# Patient Record
Sex: Male | Born: 1955
Health system: Southern US, Community
[De-identification: ages and names within clinical notes are randomized; demographics above are authoritative.]

## PROBLEM LIST (undated history)

## (undated) DIAGNOSIS — R569 Unspecified convulsions: Secondary | ICD-10-CM

## (undated) HISTORY — PX: BACK SURGERY: SHX140

---

## 1998-04-11 ENCOUNTER — Encounter: Payer: Self-pay | Admitting: Neurosurgery

## 1998-04-11 ENCOUNTER — Ambulatory Visit (HOSPITAL_COMMUNITY): Admission: RE | Admit: 1998-04-11 | Discharge: 1998-04-11 | Payer: Self-pay | Admitting: Neurosurgery

## 1998-06-12 ENCOUNTER — Encounter: Payer: Self-pay | Admitting: Neurosurgery

## 1998-06-12 ENCOUNTER — Ambulatory Visit (HOSPITAL_COMMUNITY): Admission: RE | Admit: 1998-06-12 | Discharge: 1998-06-12 | Payer: Self-pay | Admitting: Neurosurgery

## 1998-06-26 ENCOUNTER — Encounter: Payer: Self-pay | Admitting: Neurosurgery

## 1998-06-26 ENCOUNTER — Ambulatory Visit (HOSPITAL_COMMUNITY): Admission: RE | Admit: 1998-06-26 | Discharge: 1998-06-26 | Payer: Self-pay | Admitting: Neurosurgery

## 1998-07-10 ENCOUNTER — Ambulatory Visit (HOSPITAL_COMMUNITY): Admission: RE | Admit: 1998-07-10 | Discharge: 1998-07-10 | Payer: Self-pay | Admitting: Neurosurgery

## 1998-07-10 ENCOUNTER — Encounter: Payer: Self-pay | Admitting: Neurosurgery

## 1998-09-18 ENCOUNTER — Ambulatory Visit (HOSPITAL_COMMUNITY): Admission: RE | Admit: 1998-09-18 | Discharge: 1998-09-18 | Payer: Self-pay | Admitting: Neurosurgery

## 1998-09-18 ENCOUNTER — Encounter: Payer: Self-pay | Admitting: Neurosurgery

## 1999-07-20 ENCOUNTER — Emergency Department (HOSPITAL_COMMUNITY): Admission: EM | Admit: 1999-07-20 | Discharge: 1999-07-20 | Payer: Self-pay | Admitting: Emergency Medicine

## 2000-03-18 ENCOUNTER — Emergency Department (HOSPITAL_COMMUNITY): Admission: EM | Admit: 2000-03-18 | Discharge: 2000-03-18 | Payer: Self-pay | Admitting: Emergency Medicine

## 2000-03-18 ENCOUNTER — Encounter: Payer: Self-pay | Admitting: Emergency Medicine

## 2000-03-24 ENCOUNTER — Emergency Department (HOSPITAL_COMMUNITY): Admission: EM | Admit: 2000-03-24 | Discharge: 2000-03-24 | Payer: Self-pay | Admitting: Emergency Medicine

## 2000-04-25 ENCOUNTER — Encounter: Payer: Self-pay | Admitting: Emergency Medicine

## 2000-04-25 ENCOUNTER — Inpatient Hospital Stay (HOSPITAL_COMMUNITY): Admission: EM | Admit: 2000-04-25 | Discharge: 2000-05-04 | Payer: Self-pay | Admitting: Emergency Medicine

## 2000-04-26 ENCOUNTER — Encounter: Payer: Self-pay | Admitting: Internal Medicine

## 2000-04-27 ENCOUNTER — Encounter: Payer: Self-pay | Admitting: Internal Medicine

## 2000-05-02 ENCOUNTER — Encounter: Payer: Self-pay | Admitting: Internal Medicine

## 2000-05-23 ENCOUNTER — Emergency Department (HOSPITAL_COMMUNITY): Admission: EM | Admit: 2000-05-23 | Discharge: 2000-05-23 | Payer: Self-pay | Admitting: Emergency Medicine

## 2000-05-24 ENCOUNTER — Emergency Department (HOSPITAL_COMMUNITY): Admission: EM | Admit: 2000-05-24 | Discharge: 2000-05-24 | Payer: Self-pay | Admitting: Emergency Medicine

## 2000-06-01 ENCOUNTER — Inpatient Hospital Stay (HOSPITAL_COMMUNITY): Admission: RE | Admit: 2000-06-01 | Discharge: 2000-06-02 | Payer: Self-pay | Admitting: Neurosurgery

## 2000-06-01 ENCOUNTER — Encounter: Payer: Self-pay | Admitting: Neurosurgery

## 2000-06-16 ENCOUNTER — Encounter: Admission: RE | Admit: 2000-06-16 | Discharge: 2000-06-16 | Payer: Self-pay | Admitting: Neurosurgery

## 2000-06-16 ENCOUNTER — Encounter: Payer: Self-pay | Admitting: Neurosurgery

## 2000-07-26 ENCOUNTER — Encounter: Admission: RE | Admit: 2000-07-26 | Discharge: 2000-07-26 | Payer: Self-pay | Admitting: Neurosurgery

## 2000-07-26 ENCOUNTER — Encounter: Payer: Self-pay | Admitting: Neurosurgery

## 2000-08-07 ENCOUNTER — Emergency Department (HOSPITAL_COMMUNITY): Admission: EM | Admit: 2000-08-07 | Discharge: 2000-08-07 | Payer: Self-pay | Admitting: Emergency Medicine

## 2000-08-07 ENCOUNTER — Encounter: Payer: Self-pay | Admitting: Emergency Medicine

## 2000-08-08 ENCOUNTER — Emergency Department (HOSPITAL_COMMUNITY): Admission: EM | Admit: 2000-08-08 | Discharge: 2000-08-08 | Payer: Self-pay | Admitting: Emergency Medicine

## 2000-08-08 ENCOUNTER — Encounter: Payer: Self-pay | Admitting: Emergency Medicine

## 2015-11-04 DIAGNOSIS — S63295D Dislocation of distal interphalangeal joint of left ring finger, subsequent encounter: Secondary | ICD-10-CM | POA: Diagnosis not present

## 2015-11-04 DIAGNOSIS — I10 Essential (primary) hypertension: Secondary | ICD-10-CM | POA: Diagnosis not present

## 2015-11-04 DIAGNOSIS — S12601D Unspecified nondisplaced fracture of seventh cervical vertebra, subsequent encounter for fracture with routine healing: Secondary | ICD-10-CM | POA: Diagnosis not present

## 2015-11-04 DIAGNOSIS — S62605D Fracture of unspecified phalanx of left ring finger, subsequent encounter for fracture with routine healing: Secondary | ICD-10-CM | POA: Diagnosis not present

## 2015-11-04 DIAGNOSIS — S62162D Displaced fracture of pisiform, left wrist, subsequent encounter for fracture with routine healing: Secondary | ICD-10-CM | POA: Diagnosis not present

## 2015-11-04 DIAGNOSIS — R69 Illness, unspecified: Secondary | ICD-10-CM | POA: Diagnosis not present

## 2015-11-04 DIAGNOSIS — G8929 Other chronic pain: Secondary | ICD-10-CM | POA: Diagnosis not present

## 2015-11-04 DIAGNOSIS — S062X0D Diffuse traumatic brain injury without loss of consciousness, subsequent encounter: Secondary | ICD-10-CM | POA: Diagnosis not present

## 2015-11-04 DIAGNOSIS — S0240DD Maxillary fracture, left side, subsequent encounter for fracture with routine healing: Secondary | ICD-10-CM | POA: Diagnosis not present

## 2015-11-16 DIAGNOSIS — S0240DD Maxillary fracture, left side, subsequent encounter for fracture with routine healing: Secondary | ICD-10-CM | POA: Diagnosis not present

## 2015-11-16 DIAGNOSIS — S63295D Dislocation of distal interphalangeal joint of left ring finger, subsequent encounter: Secondary | ICD-10-CM | POA: Diagnosis not present

## 2015-11-16 DIAGNOSIS — S12601D Unspecified nondisplaced fracture of seventh cervical vertebra, subsequent encounter for fracture with routine healing: Secondary | ICD-10-CM | POA: Diagnosis not present

## 2015-11-16 DIAGNOSIS — G8929 Other chronic pain: Secondary | ICD-10-CM | POA: Diagnosis not present

## 2015-11-16 DIAGNOSIS — I10 Essential (primary) hypertension: Secondary | ICD-10-CM | POA: Diagnosis not present

## 2015-11-16 DIAGNOSIS — S62162D Displaced fracture of pisiform, left wrist, subsequent encounter for fracture with routine healing: Secondary | ICD-10-CM | POA: Diagnosis not present

## 2015-11-16 DIAGNOSIS — R69 Illness, unspecified: Secondary | ICD-10-CM | POA: Diagnosis not present

## 2015-11-16 DIAGNOSIS — S062X0D Diffuse traumatic brain injury without loss of consciousness, subsequent encounter: Secondary | ICD-10-CM | POA: Diagnosis not present

## 2015-11-16 DIAGNOSIS — S62605D Fracture of unspecified phalanx of left ring finger, subsequent encounter for fracture with routine healing: Secondary | ICD-10-CM | POA: Diagnosis not present

## 2015-11-19 DIAGNOSIS — W25XXXA Contact with sharp glass, initial encounter: Secondary | ICD-10-CM | POA: Diagnosis not present

## 2015-11-19 DIAGNOSIS — S61412A Laceration without foreign body of left hand, initial encounter: Secondary | ICD-10-CM | POA: Diagnosis not present

## 2015-11-19 DIAGNOSIS — S62665A Nondisplaced fracture of distal phalanx of left ring finger, initial encounter for closed fracture: Secondary | ICD-10-CM | POA: Diagnosis not present

## 2015-11-19 DIAGNOSIS — S60552A Superficial foreign body of left hand, initial encounter: Secondary | ICD-10-CM | POA: Diagnosis not present

## 2015-11-25 DIAGNOSIS — S0240DD Maxillary fracture, left side, subsequent encounter for fracture with routine healing: Secondary | ICD-10-CM | POA: Diagnosis not present

## 2015-11-25 DIAGNOSIS — S12601D Unspecified nondisplaced fracture of seventh cervical vertebra, subsequent encounter for fracture with routine healing: Secondary | ICD-10-CM | POA: Diagnosis not present

## 2015-11-25 DIAGNOSIS — G8929 Other chronic pain: Secondary | ICD-10-CM | POA: Diagnosis not present

## 2015-11-25 DIAGNOSIS — I10 Essential (primary) hypertension: Secondary | ICD-10-CM | POA: Diagnosis not present

## 2015-11-25 DIAGNOSIS — S62605D Fracture of unspecified phalanx of left ring finger, subsequent encounter for fracture with routine healing: Secondary | ICD-10-CM | POA: Diagnosis not present

## 2015-11-25 DIAGNOSIS — S62162D Displaced fracture of pisiform, left wrist, subsequent encounter for fracture with routine healing: Secondary | ICD-10-CM | POA: Diagnosis not present

## 2015-11-25 DIAGNOSIS — S062X0D Diffuse traumatic brain injury without loss of consciousness, subsequent encounter: Secondary | ICD-10-CM | POA: Diagnosis not present

## 2015-11-25 DIAGNOSIS — R69 Illness, unspecified: Secondary | ICD-10-CM | POA: Diagnosis not present

## 2015-11-25 DIAGNOSIS — S63295D Dislocation of distal interphalangeal joint of left ring finger, subsequent encounter: Secondary | ICD-10-CM | POA: Diagnosis not present

## 2015-11-27 DIAGNOSIS — S62605D Fracture of unspecified phalanx of left ring finger, subsequent encounter for fracture with routine healing: Secondary | ICD-10-CM | POA: Diagnosis not present

## 2015-11-27 DIAGNOSIS — S12601D Unspecified nondisplaced fracture of seventh cervical vertebra, subsequent encounter for fracture with routine healing: Secondary | ICD-10-CM | POA: Diagnosis not present

## 2015-11-27 DIAGNOSIS — S062X0D Diffuse traumatic brain injury without loss of consciousness, subsequent encounter: Secondary | ICD-10-CM | POA: Diagnosis not present

## 2015-11-27 DIAGNOSIS — S63295D Dislocation of distal interphalangeal joint of left ring finger, subsequent encounter: Secondary | ICD-10-CM | POA: Diagnosis not present

## 2015-11-27 DIAGNOSIS — S0240DD Maxillary fracture, left side, subsequent encounter for fracture with routine healing: Secondary | ICD-10-CM | POA: Diagnosis not present

## 2015-11-27 DIAGNOSIS — I10 Essential (primary) hypertension: Secondary | ICD-10-CM | POA: Diagnosis not present

## 2015-11-27 DIAGNOSIS — S62162D Displaced fracture of pisiform, left wrist, subsequent encounter for fracture with routine healing: Secondary | ICD-10-CM | POA: Diagnosis not present

## 2015-11-27 DIAGNOSIS — G8929 Other chronic pain: Secondary | ICD-10-CM | POA: Diagnosis not present

## 2015-11-27 DIAGNOSIS — R69 Illness, unspecified: Secondary | ICD-10-CM | POA: Diagnosis not present

## 2015-12-10 DIAGNOSIS — S61402A Unspecified open wound of left hand, initial encounter: Secondary | ICD-10-CM | POA: Diagnosis not present

## 2015-12-10 DIAGNOSIS — T814XXA Infection following a procedure, initial encounter: Secondary | ICD-10-CM | POA: Diagnosis not present

## 2015-12-10 DIAGNOSIS — Y848 Other medical procedures as the cause of abnormal reaction of the patient, or of later complication, without mention of misadventure at the time of the procedure: Secondary | ICD-10-CM | POA: Diagnosis not present

## 2016-02-02 DIAGNOSIS — R69 Illness, unspecified: Secondary | ICD-10-CM | POA: Diagnosis not present

## 2016-02-02 DIAGNOSIS — I10 Essential (primary) hypertension: Secondary | ICD-10-CM | POA: Diagnosis not present

## 2016-02-02 DIAGNOSIS — M1990 Unspecified osteoarthritis, unspecified site: Secondary | ICD-10-CM | POA: Diagnosis not present

## 2016-02-02 DIAGNOSIS — G894 Chronic pain syndrome: Secondary | ICD-10-CM | POA: Diagnosis not present

## 2016-03-21 DIAGNOSIS — M1991 Primary osteoarthritis, unspecified site: Secondary | ICD-10-CM | POA: Diagnosis not present

## 2016-03-21 DIAGNOSIS — R69 Illness, unspecified: Secondary | ICD-10-CM | POA: Diagnosis not present

## 2016-03-21 DIAGNOSIS — G894 Chronic pain syndrome: Secondary | ICD-10-CM | POA: Diagnosis not present

## 2016-03-21 DIAGNOSIS — I973 Postprocedural hypertension: Secondary | ICD-10-CM | POA: Diagnosis not present

## 2016-04-18 DIAGNOSIS — M1380 Other specified arthritis, unspecified site: Secondary | ICD-10-CM | POA: Diagnosis not present

## 2016-04-18 DIAGNOSIS — R69 Illness, unspecified: Secondary | ICD-10-CM | POA: Diagnosis not present

## 2016-04-18 DIAGNOSIS — G894 Chronic pain syndrome: Secondary | ICD-10-CM | POA: Diagnosis not present

## 2016-05-23 DIAGNOSIS — I10 Essential (primary) hypertension: Secondary | ICD-10-CM | POA: Diagnosis not present

## 2016-05-23 DIAGNOSIS — G894 Chronic pain syndrome: Secondary | ICD-10-CM | POA: Diagnosis not present

## 2016-07-07 DIAGNOSIS — G894 Chronic pain syndrome: Secondary | ICD-10-CM | POA: Diagnosis not present

## 2016-07-07 DIAGNOSIS — R69 Illness, unspecified: Secondary | ICD-10-CM | POA: Diagnosis not present

## 2016-07-07 DIAGNOSIS — M1991 Primary osteoarthritis, unspecified site: Secondary | ICD-10-CM | POA: Diagnosis not present

## 2016-07-07 DIAGNOSIS — I10 Essential (primary) hypertension: Secondary | ICD-10-CM | POA: Diagnosis not present

## 2016-08-04 DIAGNOSIS — M1991 Primary osteoarthritis, unspecified site: Secondary | ICD-10-CM | POA: Diagnosis not present

## 2016-08-04 DIAGNOSIS — I10 Essential (primary) hypertension: Secondary | ICD-10-CM | POA: Diagnosis not present

## 2016-08-04 DIAGNOSIS — G894 Chronic pain syndrome: Secondary | ICD-10-CM | POA: Diagnosis not present

## 2016-08-04 DIAGNOSIS — R69 Illness, unspecified: Secondary | ICD-10-CM | POA: Diagnosis not present

## 2016-09-01 DIAGNOSIS — M1991 Primary osteoarthritis, unspecified site: Secondary | ICD-10-CM | POA: Diagnosis not present

## 2016-09-01 DIAGNOSIS — I10 Essential (primary) hypertension: Secondary | ICD-10-CM | POA: Diagnosis not present

## 2016-09-01 DIAGNOSIS — T86822 Skin graft (allograft) (autograft) infection: Secondary | ICD-10-CM | POA: Diagnosis not present

## 2016-09-01 DIAGNOSIS — R69 Illness, unspecified: Secondary | ICD-10-CM | POA: Diagnosis not present

## 2016-09-01 DIAGNOSIS — G894 Chronic pain syndrome: Secondary | ICD-10-CM | POA: Diagnosis not present

## 2016-09-29 DIAGNOSIS — R69 Illness, unspecified: Secondary | ICD-10-CM | POA: Diagnosis not present

## 2016-09-29 DIAGNOSIS — G894 Chronic pain syndrome: Secondary | ICD-10-CM | POA: Diagnosis not present

## 2016-09-29 DIAGNOSIS — I10 Essential (primary) hypertension: Secondary | ICD-10-CM | POA: Diagnosis not present

## 2016-09-29 DIAGNOSIS — M25512 Pain in left shoulder: Secondary | ICD-10-CM | POA: Diagnosis not present

## 2016-10-27 DIAGNOSIS — R69 Illness, unspecified: Secondary | ICD-10-CM | POA: Diagnosis not present

## 2016-10-27 DIAGNOSIS — G894 Chronic pain syndrome: Secondary | ICD-10-CM | POA: Diagnosis not present

## 2016-10-27 DIAGNOSIS — M25512 Pain in left shoulder: Secondary | ICD-10-CM | POA: Diagnosis not present

## 2016-10-27 DIAGNOSIS — I10 Essential (primary) hypertension: Secondary | ICD-10-CM | POA: Diagnosis not present

## 2016-11-24 DIAGNOSIS — R69 Illness, unspecified: Secondary | ICD-10-CM | POA: Diagnosis not present

## 2016-11-24 DIAGNOSIS — G894 Chronic pain syndrome: Secondary | ICD-10-CM | POA: Diagnosis not present

## 2016-11-24 DIAGNOSIS — I10 Essential (primary) hypertension: Secondary | ICD-10-CM | POA: Diagnosis not present

## 2016-11-24 DIAGNOSIS — M25512 Pain in left shoulder: Secondary | ICD-10-CM | POA: Diagnosis not present

## 2016-12-07 ENCOUNTER — Encounter (HOSPITAL_COMMUNITY): Payer: Self-pay | Admitting: Emergency Medicine

## 2016-12-07 ENCOUNTER — Emergency Department (HOSPITAL_COMMUNITY): Payer: Medicare HMO

## 2016-12-07 ENCOUNTER — Emergency Department (HOSPITAL_COMMUNITY)
Admission: EM | Admit: 2016-12-07 | Discharge: 2016-12-07 | Disposition: A | Discharge status: Home / Self Care | Payer: Medicare HMO | Attending: Emergency Medicine | Admitting: Emergency Medicine

## 2016-12-07 DIAGNOSIS — R079 Chest pain, unspecified: Secondary | ICD-10-CM

## 2016-12-07 DIAGNOSIS — Z79899 Other long term (current) drug therapy: Secondary | ICD-10-CM | POA: Insufficient documentation

## 2016-12-07 DIAGNOSIS — R0789 Other chest pain: Secondary | ICD-10-CM | POA: Diagnosis not present

## 2016-12-07 DIAGNOSIS — R0602 Shortness of breath: Secondary | ICD-10-CM | POA: Diagnosis not present

## 2016-12-07 DIAGNOSIS — Z7982 Long term (current) use of aspirin: Secondary | ICD-10-CM | POA: Insufficient documentation

## 2016-12-07 HISTORY — DX: Unspecified convulsions: R56.9

## 2016-12-07 LAB — CBC
HEMATOCRIT: 44.3 % (ref 39.0–52.0)
Hemoglobin: 14.9 g/dL (ref 13.0–17.0)
MCH: 29.9 pg (ref 26.0–34.0)
MCHC: 33.6 g/dL (ref 30.0–36.0)
MCV: 88.8 fL (ref 78.0–100.0)
PLATELETS: 238 10*3/uL (ref 150–400)
RBC: 4.99 MIL/uL (ref 4.22–5.81)
RDW: 13.3 % (ref 11.5–15.5)
WBC: 9 10*3/uL (ref 4.0–10.5)

## 2016-12-07 LAB — BASIC METABOLIC PANEL
Anion gap: 9 (ref 5–15)
CO2: 23 mmol/L (ref 22–32)
CREATININE: 0.85 mg/dL (ref 0.61–1.24)
Calcium: 8.9 mg/dL (ref 8.9–10.3)
Chloride: 108 mmol/L (ref 101–111)
GFR calc Af Amer: >60 mL/min (ref >60)
GLUCOSE: 80 mg/dL (ref 65–99)
POTASSIUM: 4 mmol/L (ref 3.5–5.1)
Sodium: 140 mmol/L (ref 135–145)

## 2016-12-07 LAB — I-STAT TROPONIN, ED
Troponin i, poc: 0 ng/mL (ref 0.00–0.08)
Troponin i, poc: 0 ng/mL (ref 0.00–0.08)

## 2016-12-07 MED ORDER — LORAZEPAM 2 MG/ML IJ SOLN
1.0000 mg | Freq: Once | INTRAMUSCULAR | Status: AC
  Administered 2016-12-07: 1 mg via INTRAVENOUS
  Filled 2016-12-07: qty 1

## 2016-12-07 MED ORDER — MORPHINE SULFATE (PF) 4 MG/ML IV SOLN
4.0000 mg | Freq: Once | INTRAVENOUS | Status: AC
  Administered 2016-12-07: 4 mg via INTRAVENOUS
  Filled 2016-12-07: qty 1

## 2016-12-07 MED ORDER — OXYCODONE HCL 5 MG PO TABS
15.0000 mg | ORAL_TABLET | Freq: Once | ORAL | Status: AC
  Administered 2016-12-07: 15 mg via ORAL
  Filled 2016-12-07: qty 3

## 2016-12-07 NOTE — ED Provider Notes (Signed)
[facility] EMERGENCY DEPARTMENT Provider Note   CSN: [id number] Arrival date & time: 12/07/16  1943     History   Chief Complaint Chief Complaint  Patient presents with  . Chest Pain    HPI Kevin Cannon is a 61 y.o. male.  Patient here for evaluation of chest pain that started this evening, approximately 4 hours ago. He felt SOB and experienced nausea without vomiting. There was no radiation of the pain that is located in the center of his chest.  No recent cough or fever. Patient is a nonsmoker. He denies cardiac issues in the past. EMS gave him a NTG x 1 with relief of the pain. He currently is having significant pain in his neck, shoulder and back for which he takes 15 mg oxycodone and 10 mg valium. He states he woke on the floor this morning and that he feels he may have had a seizure since he doesn't remember falling out of bed. He has had seizures in the past, the last one "years" ago. He does not take any anti-seizure medication. He got himself back into bed and states when he reached for his medications he dropped his pills on the floor and was not able to reach them so he hasn't had any medication today.    The history is provided by the patient. No language interpreter was used.  Chest Pain   Associated symptoms include back pain, nausea and shortness of breath. Pertinent negatives include no abdominal pain, no cough, no fever, no vomiting and no weakness.  His past medical history is significant for seizures (See HPI.).    Past Medical History:  Diagnosis Date  . Seizures (HCC)     There are no active problems to display for this patient.   Past Surgical History:  Procedure Laterality Date  . BACK SURGERY         Home Medications    Prior to Admission medications   Medication Sig Start Date End Date Taking? Authorizing Provider  aspirin EC 81 MG tablet Take 81 mg by mouth daily.   Yes [provider]  diazepam (VALIUM) 5 MG  tablet Take 5 mg by mouth 2 (two) times daily as needed for anxiety. 11/24/16  Yes [provider]  lisinopril (PRINIVIL,ZESTRIL) 40 MG tablet Take 40 mg by mouth daily.   Yes [provider]  oxyCODONE (ROXICODONE) 15 MG immediate release tablet Take 15 mg by mouth 3 (three) times daily.   Yes [provider]  Thiamine Mononitrate (VITAMIN B1 PO) Take 1 tablet by mouth daily.   Yes [provider]    Family History No family history on file.  Social History Social History  Substance Use Topics  . Smoking status: Never Smoker  . Smokeless tobacco: Never Used  . Alcohol use 0.6 oz/week    1 Cans of beer per week     Allergies   Patient has no known allergies.   Review of Systems Review of Systems  Constitutional: Negative for chills and fever.  HENT: Negative.   Respiratory: Positive for shortness of breath. Negative for cough.   Cardiovascular: Positive for chest pain.  Gastrointestinal: Positive for nausea. Negative for abdominal pain and vomiting.  Musculoskeletal: Positive for back pain and neck pain.       See HPI.  Skin: Negative.   Neurological: Positive for seizures (See HPI.). Negative for weakness.     Physical Exam Updated Vital Signs BP 133/81   Pulse Marland Kitchen)  54   Resp 15   Ht 5\' 9"  (1.753 m)   Wt 63.5 kg (140 lb)   SpO2 100%   BMI 20.67 kg/m   Physical Exam  Constitutional: He is oriented to person, place, and time. He appears well-developed and well-nourished.  Uncomfortable appearing.  HENT:  Head: Normocephalic.  Neck: Normal range of motion. Neck supple.  Cardiovascular: Normal rate and regular rhythm.   No murmur heard. Pulmonary/Chest: Effort normal and breath sounds normal. He has no wheezes. He has no rales. He exhibits no tenderness.  Abdominal: Soft. Bowel sounds are normal. There is no tenderness. There is no rebound and no guarding.  Musculoskeletal: Normal range of motion. He exhibits no edema.    Neurological: He is alert and oriented to person, place, and time.  Skin: Skin is warm and dry. No rash noted.  Psychiatric: He has a normal mood and affect.     ED Treatments / Results  Labs (all labs ordered are listed, but only abnormal results are displayed) Labs Reviewed  BASIC METABOLIC PANEL - Abnormal; Notable for the following:       Result Value   BUN <5 (*)    All other components within normal limits  CBC  I-STAT TROPONIN, ED   Results for orders placed or performed during the hospital encounter of 12/07/16  Basic metabolic panel  Result Value Ref Range   Sodium 140 135 - 145 mmol/L   Potassium 4.0 3.5 - 5.1 mmol/L   Chloride 108 101 - 111 mmol/L   CO2 23 22 - 32 mmol/L   Glucose, Bld 80 65 - 99 mg/dL   BUN <5 (L) 6 - 20 mg/dL   Creatinine, Ser 1.610.85 0.61 - 1.24 mg/dL   Calcium 8.9 8.9 - 09.610.3 mg/dL   GFR calc non Af Amer >60 >60 mL/min   GFR calc Af Amer >60 >60 mL/min   Anion gap 9 5 - 15  CBC  Result Value Ref Range   WBC 9.0 4.0 - 10.5 K/uL   RBC 4.99 4.22 - 5.81 MIL/uL   Hemoglobin 14.9 13.0 - 17.0 g/dL   HCT 04.544.3 40.939.0 - 81.152.0 %   MCV 88.8 78.0 - 100.0 fL   MCH 29.9 26.0 - 34.0 pg   MCHC 33.6 30.0 - 36.0 g/dL   RDW 91.413.3 78.211.5 - 95.615.5 %   Platelets 238 150 - 400 K/uL  I-stat troponin, ED  Result Value Ref Range   Troponin i, poc 0.00 0.00 - 0.08 ng/mL   Comment 3            EKG  EKG Interpretation None       Radiology Dg Chest 2 View  Result Date: 12/07/2016 CLINICAL DATA:  Chest pain and shortness of Breath EXAM: CHEST  2 VIEW COMPARISON:  07/20/2014 FINDINGS: Postsurgical changes in the left clavicle are noted. The lungs are well aerated bilaterally without focal infiltrate. The cardiac shadow is within normal limits. No acute bony abnormality is noted. IMPRESSION: No active cardiopulmonary disease. Electronically Signed   By: Alcide CleverMark  Lukens M.D.   On: 12/07/2016 21:13    Procedures Procedures (including critical care time)  Medications  Ordered in ED Medications  morphine 4 MG/ML injection 4 mg (4 mg Intravenous Given 12/07/16 2042)  LORazepam (ATIVAN) injection 1 mg (1 mg Intravenous Given 12/07/16 2040)     Initial Impression / Assessment and Plan / ED Course  I have reviewed the triage vital signs and the nursing notes.  Pertinent labs & imaging results that were available during my care of the patient were reviewed by me and considered in my medical decision making (see chart for details).     Patient with chest pain earlier this evening. This is a new pain for him. He also complains of his chronic neck, back and shoulder pain.   Labs, EKG are reassuring. Chest pain is not typical for ACS. Normal troponin.   He is given IV Morphine and valium to address his chronic pain. Plan to get a delta troponin but anticipate discharge home.   Delta troponin is negative. Re-evaluation: the patient is feeling much better. He is eating and drinking. VSS. He can be discharged to home with PCP follow up for recheck and further evaluation as needed.  Final Clinical Impressions(s) / ED Diagnoses   Final diagnoses:  None   1. Nonspecific chest pain  New Prescriptions New Prescriptions   No medications on file     Danne Harbor 12/07/16 [id number]    Lavera Guise, MD 12/08/16 0005

## 2016-12-07 NOTE — Discharge Instructions (Signed)
Follow up with your doctor for recheck of chest pain and any further evaluation in the outpatient setting necessary. Return to the emergency department with any worsening symptoms or new concerns.

## 2016-12-07 NOTE — ED Triage Notes (Signed)
Per EMS CP and pt had seizure earlier today, 8/10 center of chest started today, 1 nitro brought pain down to 6/10, no cardiac h/s

## 2016-12-07 NOTE — ED Notes (Signed)
Pt given sandwich and coke per [employer] CorporationSherri

## 2016-12-16 DIAGNOSIS — R69 Illness, unspecified: Secondary | ICD-10-CM | POA: Diagnosis not present

## 2016-12-21 DIAGNOSIS — I10 Essential (primary) hypertension: Secondary | ICD-10-CM | POA: Diagnosis not present

## 2016-12-21 DIAGNOSIS — R69 Illness, unspecified: Secondary | ICD-10-CM | POA: Diagnosis not present

## 2016-12-21 DIAGNOSIS — G894 Chronic pain syndrome: Secondary | ICD-10-CM | POA: Diagnosis not present

## 2016-12-21 DIAGNOSIS — M25512 Pain in left shoulder: Secondary | ICD-10-CM | POA: Diagnosis not present

## 2016-12-28 DIAGNOSIS — R825 Elevated urine levels of drugs, medicaments and biological substances: Secondary | ICD-10-CM | POA: Diagnosis not present

## 2016-12-28 DIAGNOSIS — I252 Old myocardial infarction: Secondary | ICD-10-CM | POA: Diagnosis not present

## 2016-12-28 DIAGNOSIS — Z79899 Other long term (current) drug therapy: Secondary | ICD-10-CM | POA: Diagnosis not present

## 2016-12-28 DIAGNOSIS — Z7982 Long term (current) use of aspirin: Secondary | ICD-10-CM | POA: Diagnosis not present

## 2016-12-28 DIAGNOSIS — G894 Chronic pain syndrome: Secondary | ICD-10-CM | POA: Diagnosis not present

## 2016-12-28 DIAGNOSIS — T1491XA Suicide attempt, initial encounter: Secondary | ICD-10-CM | POA: Diagnosis not present

## 2016-12-28 DIAGNOSIS — F329 Major depressive disorder, single episode, unspecified: Secondary | ICD-10-CM | POA: Diagnosis not present

## 2016-12-28 DIAGNOSIS — R69 Illness, unspecified: Secondary | ICD-10-CM | POA: Diagnosis not present

## 2016-12-28 DIAGNOSIS — Z79891 Long term (current) use of opiate analgesic: Secondary | ICD-10-CM | POA: Diagnosis not present

## 2016-12-28 DIAGNOSIS — F10920 Alcohol use, unspecified with intoxication, uncomplicated: Secondary | ICD-10-CM | POA: Diagnosis not present

## 2016-12-28 DIAGNOSIS — I10 Essential (primary) hypertension: Secondary | ICD-10-CM | POA: Diagnosis not present

## 2016-12-28 DIAGNOSIS — T887XXA Unspecified adverse effect of drug or medicament, initial encounter: Secondary | ICD-10-CM | POA: Diagnosis not present

## 2017-01-31 DIAGNOSIS — G894 Chronic pain syndrome: Secondary | ICD-10-CM | POA: Diagnosis not present

## 2017-01-31 DIAGNOSIS — M25512 Pain in left shoulder: Secondary | ICD-10-CM | POA: Diagnosis not present

## 2017-01-31 DIAGNOSIS — R69 Illness, unspecified: Secondary | ICD-10-CM | POA: Diagnosis not present

## 2017-01-31 DIAGNOSIS — I10 Essential (primary) hypertension: Secondary | ICD-10-CM | POA: Diagnosis not present

## 2017-02-28 DIAGNOSIS — G894 Chronic pain syndrome: Secondary | ICD-10-CM | POA: Diagnosis not present

## 2017-02-28 DIAGNOSIS — M25512 Pain in left shoulder: Secondary | ICD-10-CM | POA: Diagnosis not present

## 2017-02-28 DIAGNOSIS — I10 Essential (primary) hypertension: Secondary | ICD-10-CM | POA: Diagnosis not present

## 2017-02-28 DIAGNOSIS — R69 Illness, unspecified: Secondary | ICD-10-CM | POA: Diagnosis not present

## 2017-03-29 DIAGNOSIS — M25512 Pain in left shoulder: Secondary | ICD-10-CM | POA: Diagnosis not present

## 2017-03-29 DIAGNOSIS — I10 Essential (primary) hypertension: Secondary | ICD-10-CM | POA: Diagnosis not present

## 2017-03-29 DIAGNOSIS — G894 Chronic pain syndrome: Secondary | ICD-10-CM | POA: Diagnosis not present

## 2017-03-29 DIAGNOSIS — R69 Illness, unspecified: Secondary | ICD-10-CM | POA: Diagnosis not present

## 2017-04-25 DIAGNOSIS — R69 Illness, unspecified: Secondary | ICD-10-CM | POA: Diagnosis not present

## 2017-04-25 DIAGNOSIS — M25512 Pain in left shoulder: Secondary | ICD-10-CM | POA: Diagnosis not present

## 2017-04-25 DIAGNOSIS — G894 Chronic pain syndrome: Secondary | ICD-10-CM | POA: Diagnosis not present

## 2017-04-25 DIAGNOSIS — I10 Essential (primary) hypertension: Secondary | ICD-10-CM | POA: Diagnosis not present

## 2017-05-23 DIAGNOSIS — G894 Chronic pain syndrome: Secondary | ICD-10-CM | POA: Diagnosis not present

## 2017-05-23 DIAGNOSIS — M25512 Pain in left shoulder: Secondary | ICD-10-CM | POA: Diagnosis not present

## 2017-05-23 DIAGNOSIS — R69 Illness, unspecified: Secondary | ICD-10-CM | POA: Diagnosis not present

## 2017-05-23 DIAGNOSIS — I10 Essential (primary) hypertension: Secondary | ICD-10-CM | POA: Diagnosis not present

## 2017-06-24 DIAGNOSIS — R739 Hyperglycemia, unspecified: Secondary | ICD-10-CM | POA: Diagnosis not present

## 2017-06-24 DIAGNOSIS — R69 Illness, unspecified: Secondary | ICD-10-CM | POA: Diagnosis not present

## 2017-06-24 DIAGNOSIS — L02413 Cutaneous abscess of right upper limb: Secondary | ICD-10-CM | POA: Diagnosis not present

## 2017-06-24 DIAGNOSIS — F1510 Other stimulant abuse, uncomplicated: Secondary | ICD-10-CM | POA: Diagnosis not present

## 2017-06-24 DIAGNOSIS — G8929 Other chronic pain: Secondary | ICD-10-CM | POA: Diagnosis not present

## 2017-06-24 DIAGNOSIS — T424X1A Poisoning by benzodiazepines, accidental (unintentional), initial encounter: Secondary | ICD-10-CM | POA: Diagnosis not present

## 2017-06-25 DIAGNOSIS — G894 Chronic pain syndrome: Secondary | ICD-10-CM | POA: Diagnosis not present

## 2017-06-25 DIAGNOSIS — L02413 Cutaneous abscess of right upper limb: Secondary | ICD-10-CM | POA: Diagnosis not present

## 2017-06-25 DIAGNOSIS — M549 Dorsalgia, unspecified: Secondary | ICD-10-CM | POA: Diagnosis not present

## 2017-06-25 DIAGNOSIS — R739 Hyperglycemia, unspecified: Secondary | ICD-10-CM | POA: Diagnosis not present

## 2017-06-25 DIAGNOSIS — I252 Old myocardial infarction: Secondary | ICD-10-CM | POA: Diagnosis not present

## 2017-06-25 DIAGNOSIS — T424X1A Poisoning by benzodiazepines, accidental (unintentional), initial encounter: Secondary | ICD-10-CM | POA: Diagnosis not present

## 2017-06-25 DIAGNOSIS — R69 Illness, unspecified: Secondary | ICD-10-CM | POA: Diagnosis not present

## 2017-06-25 DIAGNOSIS — I10 Essential (primary) hypertension: Secondary | ICD-10-CM | POA: Diagnosis not present

## 2017-06-25 DIAGNOSIS — F1510 Other stimulant abuse, uncomplicated: Secondary | ICD-10-CM | POA: Diagnosis not present

## 2017-06-25 DIAGNOSIS — M542 Cervicalgia: Secondary | ICD-10-CM | POA: Diagnosis not present

## 2017-06-25 DIAGNOSIS — I517 Cardiomegaly: Secondary | ICD-10-CM | POA: Diagnosis not present

## 2017-06-25 DIAGNOSIS — G8929 Other chronic pain: Secondary | ICD-10-CM | POA: Diagnosis not present

## 2017-06-26 DIAGNOSIS — I517 Cardiomegaly: Secondary | ICD-10-CM

## 2017-07-04 DIAGNOSIS — I10 Essential (primary) hypertension: Secondary | ICD-10-CM | POA: Diagnosis not present

## 2017-07-04 DIAGNOSIS — G8929 Other chronic pain: Secondary | ICD-10-CM | POA: Diagnosis not present

## 2017-07-04 DIAGNOSIS — B954 Other streptococcus as the cause of diseases classified elsewhere: Secondary | ICD-10-CM | POA: Diagnosis not present

## 2017-07-04 DIAGNOSIS — L03113 Cellulitis of right upper limb: Secondary | ICD-10-CM | POA: Diagnosis not present

## 2017-07-04 DIAGNOSIS — I82621 Acute embolism and thrombosis of deep veins of right upper extremity: Secondary | ICD-10-CM | POA: Diagnosis not present

## 2017-07-04 DIAGNOSIS — Z79891 Long term (current) use of opiate analgesic: Secondary | ICD-10-CM | POA: Diagnosis not present

## 2017-07-04 DIAGNOSIS — I252 Old myocardial infarction: Secondary | ICD-10-CM | POA: Diagnosis not present

## 2017-07-04 DIAGNOSIS — L02818 Cutaneous abscess of other sites: Secondary | ICD-10-CM | POA: Diagnosis not present

## 2017-07-04 DIAGNOSIS — L02413 Cutaneous abscess of right upper limb: Secondary | ICD-10-CM | POA: Diagnosis not present

## 2017-07-04 DIAGNOSIS — E02 Subclinical iodine-deficiency hypothyroidism: Secondary | ICD-10-CM | POA: Diagnosis not present

## 2017-07-04 DIAGNOSIS — Z87898 Personal history of other specified conditions: Secondary | ICD-10-CM | POA: Diagnosis not present

## 2017-07-18 DIAGNOSIS — I10 Essential (primary) hypertension: Secondary | ICD-10-CM | POA: Diagnosis not present

## 2017-07-18 DIAGNOSIS — R69 Illness, unspecified: Secondary | ICD-10-CM | POA: Diagnosis not present

## 2017-07-18 DIAGNOSIS — G894 Chronic pain syndrome: Secondary | ICD-10-CM | POA: Diagnosis not present

## 2017-07-18 DIAGNOSIS — M25512 Pain in left shoulder: Secondary | ICD-10-CM | POA: Diagnosis not present

## 2017-08-09 DIAGNOSIS — H35362 Drusen (degenerative) of macula, left eye: Secondary | ICD-10-CM | POA: Diagnosis not present

## 2017-08-09 DIAGNOSIS — H5203 Hypermetropia, bilateral: Secondary | ICD-10-CM | POA: Diagnosis not present

## 2017-08-09 DIAGNOSIS — H35382 Toxic maculopathy, left eye: Secondary | ICD-10-CM | POA: Diagnosis not present

## 2017-08-09 DIAGNOSIS — H52223 Regular astigmatism, bilateral: Secondary | ICD-10-CM | POA: Diagnosis not present

## 2017-08-09 DIAGNOSIS — H524 Presbyopia: Secondary | ICD-10-CM | POA: Diagnosis not present

## 2017-08-09 DIAGNOSIS — H348312 Tributary (branch) retinal vein occlusion, right eye, stable: Secondary | ICD-10-CM | POA: Diagnosis not present

## 2017-08-09 DIAGNOSIS — I10 Essential (primary) hypertension: Secondary | ICD-10-CM | POA: Diagnosis not present

## 2017-08-15 DIAGNOSIS — M25512 Pain in left shoulder: Secondary | ICD-10-CM | POA: Diagnosis not present

## 2017-08-15 DIAGNOSIS — R69 Illness, unspecified: Secondary | ICD-10-CM | POA: Diagnosis not present

## 2017-08-15 DIAGNOSIS — G894 Chronic pain syndrome: Secondary | ICD-10-CM | POA: Diagnosis not present

## 2017-08-15 DIAGNOSIS — I10 Essential (primary) hypertension: Secondary | ICD-10-CM | POA: Diagnosis not present

## 2017-08-17 DIAGNOSIS — I252 Old myocardial infarction: Secondary | ICD-10-CM | POA: Diagnosis not present

## 2017-08-17 DIAGNOSIS — R69 Illness, unspecified: Secondary | ICD-10-CM | POA: Diagnosis not present

## 2017-08-17 DIAGNOSIS — T50904A Poisoning by unspecified drugs, medicaments and biological substances, undetermined, initial encounter: Secondary | ICD-10-CM | POA: Diagnosis not present

## 2017-08-17 DIAGNOSIS — F17210 Nicotine dependence, cigarettes, uncomplicated: Secondary | ICD-10-CM | POA: Diagnosis not present

## 2017-08-17 DIAGNOSIS — G894 Chronic pain syndrome: Secondary | ICD-10-CM | POA: Diagnosis not present

## 2017-08-17 DIAGNOSIS — F1110 Opioid abuse, uncomplicated: Secondary | ICD-10-CM | POA: Diagnosis not present

## 2017-08-17 DIAGNOSIS — T887XXA Unspecified adverse effect of drug or medicament, initial encounter: Secondary | ICD-10-CM | POA: Diagnosis not present

## 2017-08-17 DIAGNOSIS — Z792 Long term (current) use of antibiotics: Secondary | ICD-10-CM | POA: Diagnosis not present

## 2017-08-17 DIAGNOSIS — I10 Essential (primary) hypertension: Secondary | ICD-10-CM | POA: Diagnosis not present

## 2017-08-17 DIAGNOSIS — Z79899 Other long term (current) drug therapy: Secondary | ICD-10-CM | POA: Diagnosis not present

## 2017-08-17 DIAGNOSIS — R404 Transient alteration of awareness: Secondary | ICD-10-CM | POA: Diagnosis not present

## 2017-09-13 DIAGNOSIS — R0602 Shortness of breath: Secondary | ICD-10-CM | POA: Diagnosis not present

## 2017-09-13 DIAGNOSIS — S40211A Abrasion of right shoulder, initial encounter: Secondary | ICD-10-CM | POA: Diagnosis not present

## 2017-09-13 DIAGNOSIS — R69 Illness, unspecified: Secondary | ICD-10-CM | POA: Diagnosis not present

## 2017-09-13 DIAGNOSIS — I10 Essential (primary) hypertension: Secondary | ICD-10-CM | POA: Diagnosis not present

## 2017-09-13 DIAGNOSIS — R4182 Altered mental status, unspecified: Secondary | ICD-10-CM | POA: Diagnosis not present

## 2017-09-13 DIAGNOSIS — R404 Transient alteration of awareness: Secondary | ICD-10-CM | POA: Diagnosis not present

## 2017-09-13 DIAGNOSIS — X58XXXA Exposure to other specified factors, initial encounter: Secondary | ICD-10-CM | POA: Diagnosis not present

## 2017-09-13 DIAGNOSIS — R410 Disorientation, unspecified: Secondary | ICD-10-CM | POA: Diagnosis not present

## 2017-09-13 DIAGNOSIS — R569 Unspecified convulsions: Secondary | ICD-10-CM | POA: Diagnosis not present

## 2017-09-13 DIAGNOSIS — R000 Tachycardia, unspecified: Secondary | ICD-10-CM | POA: Diagnosis not present

## 2017-09-13 DIAGNOSIS — S43401A Unspecified sprain of right shoulder joint, initial encounter: Secondary | ICD-10-CM | POA: Diagnosis not present

## 2017-09-13 DIAGNOSIS — S46911A Strain of unspecified muscle, fascia and tendon at shoulder and upper arm level, right arm, initial encounter: Secondary | ICD-10-CM | POA: Diagnosis not present

## 2017-09-14 DIAGNOSIS — M25512 Pain in left shoulder: Secondary | ICD-10-CM | POA: Diagnosis not present

## 2017-09-14 DIAGNOSIS — I10 Essential (primary) hypertension: Secondary | ICD-10-CM | POA: Diagnosis not present

## 2017-09-14 DIAGNOSIS — R69 Illness, unspecified: Secondary | ICD-10-CM | POA: Diagnosis not present

## 2017-09-14 DIAGNOSIS — G894 Chronic pain syndrome: Secondary | ICD-10-CM | POA: Diagnosis not present

## 2017-10-02 DIAGNOSIS — K1379 Other lesions of oral mucosa: Secondary | ICD-10-CM | POA: Diagnosis not present

## 2017-10-02 DIAGNOSIS — K130 Diseases of lips: Secondary | ICD-10-CM | POA: Diagnosis not present

## 2017-10-03 DIAGNOSIS — F1994 Other psychoactive substance use, unspecified with psychoactive substance-induced mood disorder: Secondary | ICD-10-CM | POA: Diagnosis not present

## 2017-10-03 DIAGNOSIS — R69 Illness, unspecified: Secondary | ICD-10-CM | POA: Diagnosis not present

## 2017-10-03 DIAGNOSIS — Z6820 Body mass index (BMI) 20.0-20.9, adult: Secondary | ICD-10-CM | POA: Diagnosis not present

## 2017-10-03 DIAGNOSIS — F19980 Other psychoactive substance use, unspecified with psychoactive substance-induced anxiety disorder: Secondary | ICD-10-CM | POA: Diagnosis not present

## 2017-10-03 DIAGNOSIS — B182 Chronic viral hepatitis C: Secondary | ICD-10-CM | POA: Diagnosis not present

## 2017-10-03 DIAGNOSIS — Z79899 Other long term (current) drug therapy: Secondary | ICD-10-CM | POA: Diagnosis not present

## 2017-10-03 DIAGNOSIS — F1910 Other psychoactive substance abuse, uncomplicated: Secondary | ICD-10-CM | POA: Diagnosis not present

## 2017-10-05 DIAGNOSIS — K130 Diseases of lips: Secondary | ICD-10-CM | POA: Diagnosis not present

## 2017-10-10 DIAGNOSIS — F1910 Other psychoactive substance abuse, uncomplicated: Secondary | ICD-10-CM | POA: Diagnosis not present

## 2017-10-10 DIAGNOSIS — Z79899 Other long term (current) drug therapy: Secondary | ICD-10-CM | POA: Diagnosis not present

## 2017-10-10 DIAGNOSIS — B182 Chronic viral hepatitis C: Secondary | ICD-10-CM | POA: Diagnosis not present

## 2017-10-10 DIAGNOSIS — F1994 Other psychoactive substance use, unspecified with psychoactive substance-induced mood disorder: Secondary | ICD-10-CM | POA: Diagnosis not present

## 2017-10-10 DIAGNOSIS — F19980 Other psychoactive substance use, unspecified with psychoactive substance-induced anxiety disorder: Secondary | ICD-10-CM | POA: Diagnosis not present

## 2017-10-10 DIAGNOSIS — Z681 Body mass index (BMI) 19 or less, adult: Secondary | ICD-10-CM | POA: Diagnosis not present

## 2017-10-10 DIAGNOSIS — R69 Illness, unspecified: Secondary | ICD-10-CM | POA: Diagnosis not present

## 2017-10-12 DIAGNOSIS — R69 Illness, unspecified: Secondary | ICD-10-CM | POA: Diagnosis not present

## 2017-10-12 DIAGNOSIS — G894 Chronic pain syndrome: Secondary | ICD-10-CM | POA: Diagnosis not present

## 2017-10-12 DIAGNOSIS — M25512 Pain in left shoulder: Secondary | ICD-10-CM | POA: Diagnosis not present

## 2017-10-12 DIAGNOSIS — I10 Essential (primary) hypertension: Secondary | ICD-10-CM | POA: Diagnosis not present

## 2017-11-16 DIAGNOSIS — I10 Essential (primary) hypertension: Secondary | ICD-10-CM | POA: Diagnosis not present

## 2017-11-16 DIAGNOSIS — R69 Illness, unspecified: Secondary | ICD-10-CM | POA: Diagnosis not present

## 2017-11-16 DIAGNOSIS — M25512 Pain in left shoulder: Secondary | ICD-10-CM | POA: Diagnosis not present

## 2017-11-16 DIAGNOSIS — G894 Chronic pain syndrome: Secondary | ICD-10-CM | POA: Diagnosis not present

## 2017-11-17 DIAGNOSIS — T424X1A Poisoning by benzodiazepines, accidental (unintentional), initial encounter: Secondary | ICD-10-CM | POA: Diagnosis not present

## 2017-11-17 DIAGNOSIS — R404 Transient alteration of awareness: Secondary | ICD-10-CM | POA: Diagnosis not present

## 2017-11-17 DIAGNOSIS — R410 Disorientation, unspecified: Secondary | ICD-10-CM | POA: Diagnosis not present

## 2017-11-17 DIAGNOSIS — R4020 Unspecified coma: Secondary | ICD-10-CM | POA: Diagnosis not present

## 2017-11-17 DIAGNOSIS — R69 Illness, unspecified: Secondary | ICD-10-CM | POA: Diagnosis not present

## 2017-11-17 DIAGNOSIS — T50901A Poisoning by unspecified drugs, medicaments and biological substances, accidental (unintentional), initial encounter: Secondary | ICD-10-CM | POA: Diagnosis not present

## 2017-11-17 DIAGNOSIS — J984 Other disorders of lung: Secondary | ICD-10-CM | POA: Diagnosis not present

## 2017-11-30 DIAGNOSIS — G894 Chronic pain syndrome: Secondary | ICD-10-CM | POA: Diagnosis not present

## 2017-11-30 DIAGNOSIS — R69 Illness, unspecified: Secondary | ICD-10-CM | POA: Diagnosis not present

## 2017-11-30 DIAGNOSIS — M25512 Pain in left shoulder: Secondary | ICD-10-CM | POA: Diagnosis not present

## 2017-11-30 DIAGNOSIS — I10 Essential (primary) hypertension: Secondary | ICD-10-CM | POA: Diagnosis not present

## 2018-01-10 DIAGNOSIS — I10 Essential (primary) hypertension: Secondary | ICD-10-CM | POA: Diagnosis not present

## 2018-01-10 DIAGNOSIS — Z79899 Other long term (current) drug therapy: Secondary | ICD-10-CM | POA: Diagnosis not present

## 2018-01-10 DIAGNOSIS — G894 Chronic pain syndrome: Secondary | ICD-10-CM | POA: Diagnosis not present

## 2018-01-10 DIAGNOSIS — W19XXXA Unspecified fall, initial encounter: Secondary | ICD-10-CM | POA: Diagnosis not present

## 2018-01-19 DIAGNOSIS — R410 Disorientation, unspecified: Secondary | ICD-10-CM | POA: Diagnosis not present

## 2018-01-19 DIAGNOSIS — I252 Old myocardial infarction: Secondary | ICD-10-CM | POA: Diagnosis not present

## 2018-01-19 DIAGNOSIS — M545 Low back pain: Secondary | ICD-10-CM | POA: Diagnosis not present

## 2018-01-19 DIAGNOSIS — M47812 Spondylosis without myelopathy or radiculopathy, cervical region: Secondary | ICD-10-CM | POA: Diagnosis not present

## 2018-01-19 DIAGNOSIS — F1910 Other psychoactive substance abuse, uncomplicated: Secondary | ICD-10-CM | POA: Diagnosis not present

## 2018-01-19 DIAGNOSIS — R4182 Altered mental status, unspecified: Secondary | ICD-10-CM | POA: Diagnosis not present

## 2018-01-19 DIAGNOSIS — M5489 Other dorsalgia: Secondary | ICD-10-CM | POA: Diagnosis not present

## 2018-01-19 DIAGNOSIS — E860 Dehydration: Secondary | ICD-10-CM | POA: Diagnosis not present

## 2018-01-19 DIAGNOSIS — G894 Chronic pain syndrome: Secondary | ICD-10-CM | POA: Diagnosis not present

## 2018-01-19 DIAGNOSIS — I10 Essential (primary) hypertension: Secondary | ICD-10-CM | POA: Diagnosis not present

## 2018-01-19 DIAGNOSIS — Z79899 Other long term (current) drug therapy: Secondary | ICD-10-CM | POA: Diagnosis not present

## 2018-01-19 DIAGNOSIS — M549 Dorsalgia, unspecified: Secondary | ICD-10-CM | POA: Diagnosis not present

## 2018-01-19 DIAGNOSIS — R110 Nausea: Secondary | ICD-10-CM | POA: Diagnosis not present

## 2018-01-19 DIAGNOSIS — R69 Illness, unspecified: Secondary | ICD-10-CM | POA: Diagnosis not present

## 2018-01-19 DIAGNOSIS — Z8669 Personal history of other diseases of the nervous system and sense organs: Secondary | ICD-10-CM | POA: Diagnosis not present

## 2018-01-19 DIAGNOSIS — R52 Pain, unspecified: Secondary | ICD-10-CM | POA: Diagnosis not present

## 2018-01-20 DIAGNOSIS — E860 Dehydration: Secondary | ICD-10-CM | POA: Diagnosis not present

## 2018-01-20 DIAGNOSIS — Z79899 Other long term (current) drug therapy: Secondary | ICD-10-CM | POA: Diagnosis not present

## 2018-01-20 DIAGNOSIS — Z8669 Personal history of other diseases of the nervous system and sense organs: Secondary | ICD-10-CM | POA: Diagnosis not present

## 2018-01-20 DIAGNOSIS — R4182 Altered mental status, unspecified: Secondary | ICD-10-CM | POA: Diagnosis not present

## 2018-01-20 DIAGNOSIS — M549 Dorsalgia, unspecified: Secondary | ICD-10-CM | POA: Diagnosis not present

## 2018-01-20 DIAGNOSIS — R69 Illness, unspecified: Secondary | ICD-10-CM | POA: Diagnosis not present

## 2018-01-20 DIAGNOSIS — M47812 Spondylosis without myelopathy or radiculopathy, cervical region: Secondary | ICD-10-CM | POA: Diagnosis not present

## 2018-01-20 DIAGNOSIS — I252 Old myocardial infarction: Secondary | ICD-10-CM | POA: Diagnosis not present

## 2018-01-20 DIAGNOSIS — G894 Chronic pain syndrome: Secondary | ICD-10-CM | POA: Diagnosis not present

## 2018-01-20 DIAGNOSIS — I10 Essential (primary) hypertension: Secondary | ICD-10-CM | POA: Diagnosis not present

## 2018-03-20 DIAGNOSIS — H52209 Unspecified astigmatism, unspecified eye: Secondary | ICD-10-CM | POA: Diagnosis not present

## 2018-03-20 DIAGNOSIS — H5203 Hypermetropia, bilateral: Secondary | ICD-10-CM | POA: Diagnosis not present

## 2018-03-20 DIAGNOSIS — H524 Presbyopia: Secondary | ICD-10-CM | POA: Diagnosis not present

## 2018-03-27 DIAGNOSIS — F1910 Other psychoactive substance abuse, uncomplicated: Secondary | ICD-10-CM | POA: Diagnosis not present

## 2018-03-27 DIAGNOSIS — G894 Chronic pain syndrome: Secondary | ICD-10-CM | POA: Diagnosis not present

## 2018-03-27 DIAGNOSIS — I10 Essential (primary) hypertension: Secondary | ICD-10-CM | POA: Diagnosis not present

## 2018-03-27 DIAGNOSIS — R69 Illness, unspecified: Secondary | ICD-10-CM | POA: Diagnosis not present

## 2018-03-27 DIAGNOSIS — Z8709 Personal history of other diseases of the respiratory system: Secondary | ICD-10-CM | POA: Diagnosis not present

## 2018-03-27 DIAGNOSIS — F418 Other specified anxiety disorders: Secondary | ICD-10-CM | POA: Diagnosis not present

## 2018-03-27 DIAGNOSIS — R569 Unspecified convulsions: Secondary | ICD-10-CM | POA: Diagnosis not present

## 2018-03-27 DIAGNOSIS — Z79899 Other long term (current) drug therapy: Secondary | ICD-10-CM | POA: Diagnosis not present

## 2018-03-28 DIAGNOSIS — G894 Chronic pain syndrome: Secondary | ICD-10-CM | POA: Diagnosis not present

## 2018-03-28 DIAGNOSIS — I10 Essential (primary) hypertension: Secondary | ICD-10-CM | POA: Diagnosis not present

## 2018-03-28 DIAGNOSIS — R569 Unspecified convulsions: Secondary | ICD-10-CM | POA: Diagnosis not present

## 2018-03-28 DIAGNOSIS — F418 Other specified anxiety disorders: Secondary | ICD-10-CM | POA: Diagnosis not present

## 2018-03-28 DIAGNOSIS — R69 Illness, unspecified: Secondary | ICD-10-CM | POA: Diagnosis not present

## 2018-05-10 DIAGNOSIS — Z79899 Other long term (current) drug therapy: Secondary | ICD-10-CM | POA: Diagnosis not present

## 2018-05-10 DIAGNOSIS — R0602 Shortness of breath: Secondary | ICD-10-CM | POA: Diagnosis not present

## 2018-05-10 DIAGNOSIS — M545 Low back pain: Secondary | ICD-10-CM | POA: Diagnosis not present

## 2018-05-10 DIAGNOSIS — R000 Tachycardia, unspecified: Secondary | ICD-10-CM | POA: Diagnosis not present

## 2018-05-10 DIAGNOSIS — M542 Cervicalgia: Secondary | ICD-10-CM | POA: Diagnosis not present

## 2018-05-10 DIAGNOSIS — G894 Chronic pain syndrome: Secondary | ICD-10-CM | POA: Diagnosis not present

## 2018-05-10 DIAGNOSIS — R51 Headache: Secondary | ICD-10-CM | POA: Diagnosis not present

## 2018-05-10 DIAGNOSIS — I252 Old myocardial infarction: Secondary | ICD-10-CM | POA: Diagnosis not present

## 2018-05-10 DIAGNOSIS — R079 Chest pain, unspecified: Secondary | ICD-10-CM | POA: Diagnosis not present

## 2018-05-10 DIAGNOSIS — S199XXA Unspecified injury of neck, initial encounter: Secondary | ICD-10-CM | POA: Diagnosis not present

## 2018-05-10 DIAGNOSIS — I10 Essential (primary) hypertension: Secondary | ICD-10-CM | POA: Diagnosis not present

## 2018-05-10 DIAGNOSIS — R0789 Other chest pain: Secondary | ICD-10-CM | POA: Diagnosis not present

## 2018-09-15 DEATH — deceased

## 2019-02-01 IMAGING — DX DG CHEST 2V
2 series · 2 of 2 positions shown · non-contrast
Comparison: 07/20/2014

CLINICAL DATA: Chest pain and shortness of Breath

EXAM:
CHEST  2 VIEW

[chest lat]
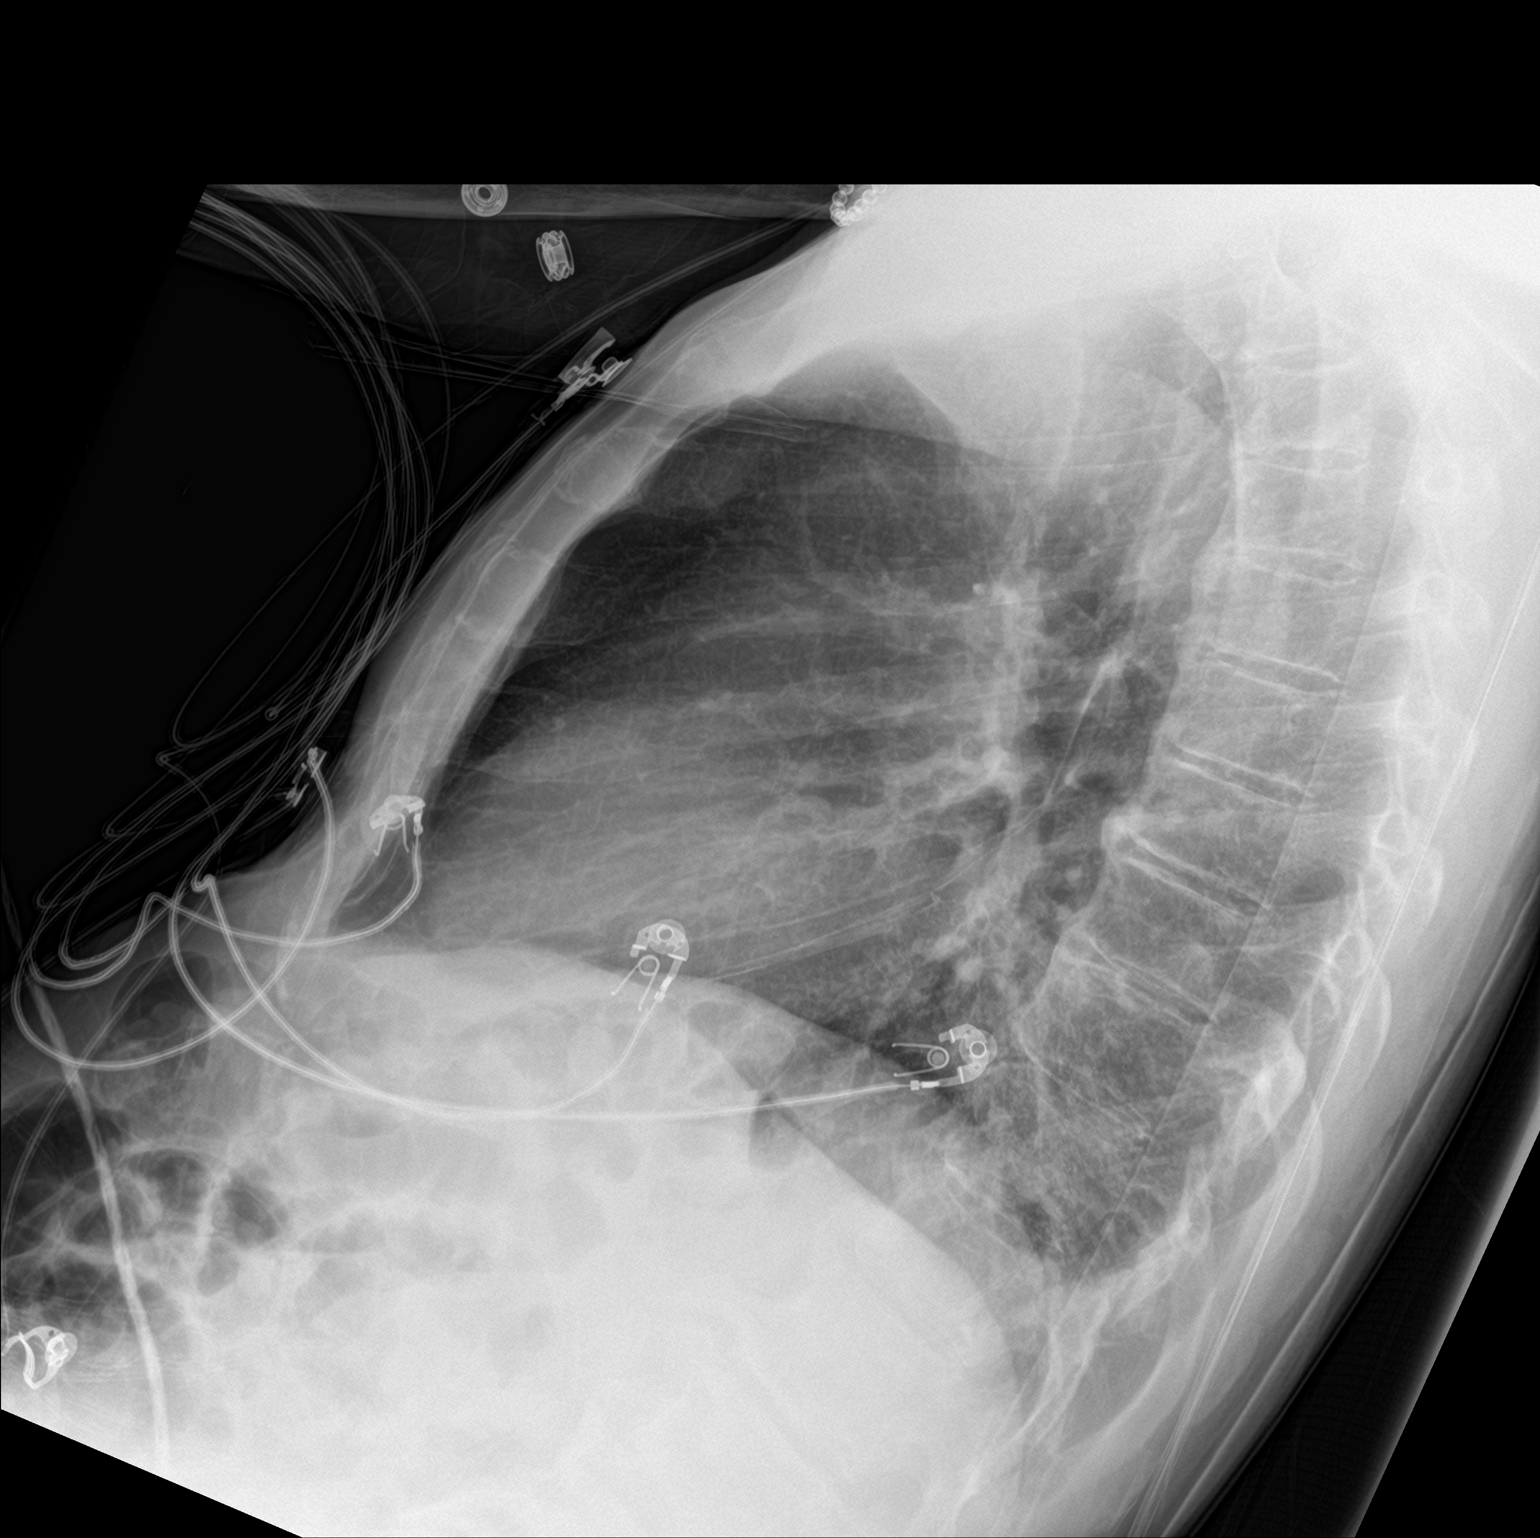

[chest ap]
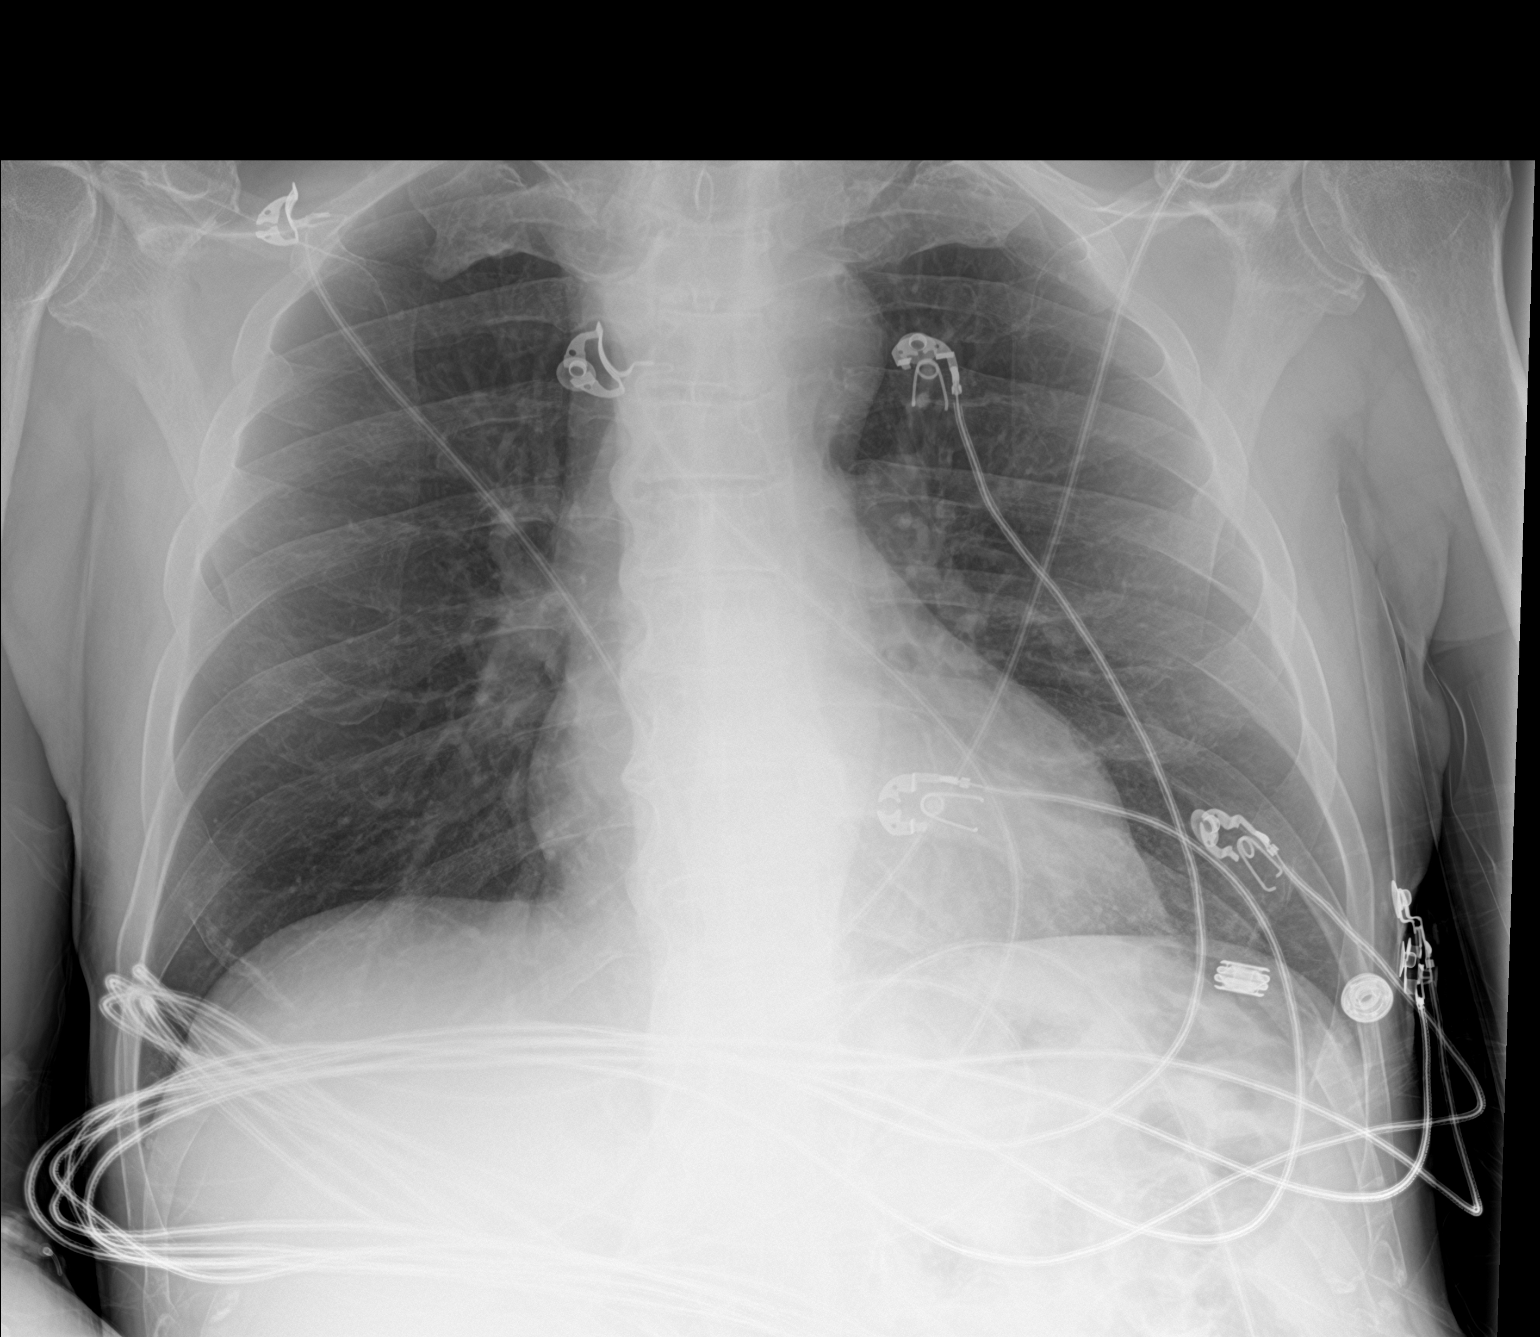

[2 of 2 positions shown; findings below may reference images not displayed]

FINDINGS: Postsurgical changes in the left clavicle are noted. The lungs are
well aerated bilaterally without focal infiltrate. The cardiac
shadow is within normal limits. No acute bony abnormality is noted.
IMPRESSION: No active cardiopulmonary disease.
